# Patient Record
Sex: Female | Born: 1945 | Race: Black or African American | Hispanic: No | Marital: Married | Smoking: Never smoker
Health system: Southern US, Community
[De-identification: ages and names within clinical notes are randomized; demographics above are authoritative.]

## PROBLEM LIST (undated history)

## (undated) DIAGNOSIS — E7800 Pure hypercholesterolemia, unspecified: Secondary | ICD-10-CM

## (undated) DIAGNOSIS — I10 Essential (primary) hypertension: Secondary | ICD-10-CM

---

## 2004-05-30 ENCOUNTER — Emergency Department (HOSPITAL_COMMUNITY): Admission: EM | Admit: 2004-05-30 | Discharge: 2004-05-30 | Payer: Self-pay | Admitting: Emergency Medicine

## 2014-01-14 DIAGNOSIS — M1990 Unspecified osteoarthritis, unspecified site: Secondary | ICD-10-CM | POA: Diagnosis not present

## 2014-01-14 DIAGNOSIS — I10 Essential (primary) hypertension: Secondary | ICD-10-CM | POA: Diagnosis not present

## 2014-01-14 DIAGNOSIS — E669 Obesity, unspecified: Secondary | ICD-10-CM | POA: Diagnosis not present

## 2014-02-14 DIAGNOSIS — E119 Type 2 diabetes mellitus without complications: Secondary | ICD-10-CM | POA: Diagnosis not present

## 2014-07-25 DIAGNOSIS — I10 Essential (primary) hypertension: Secondary | ICD-10-CM | POA: Diagnosis not present

## 2014-07-25 DIAGNOSIS — E041 Nontoxic single thyroid nodule: Secondary | ICD-10-CM | POA: Diagnosis not present

## 2014-07-25 DIAGNOSIS — E669 Obesity, unspecified: Secondary | ICD-10-CM | POA: Diagnosis not present

## 2014-08-02 DIAGNOSIS — I10 Essential (primary) hypertension: Secondary | ICD-10-CM | POA: Diagnosis not present

## 2014-08-02 DIAGNOSIS — E669 Obesity, unspecified: Secondary | ICD-10-CM | POA: Diagnosis not present

## 2014-08-23 DIAGNOSIS — E041 Nontoxic single thyroid nodule: Secondary | ICD-10-CM | POA: Diagnosis not present

## 2014-08-23 DIAGNOSIS — E049 Nontoxic goiter, unspecified: Secondary | ICD-10-CM | POA: Diagnosis not present

## 2014-11-24 DIAGNOSIS — E041 Nontoxic single thyroid nodule: Secondary | ICD-10-CM | POA: Diagnosis not present

## 2014-11-24 DIAGNOSIS — E669 Obesity, unspecified: Secondary | ICD-10-CM | POA: Diagnosis not present

## 2014-11-24 DIAGNOSIS — I10 Essential (primary) hypertension: Secondary | ICD-10-CM | POA: Diagnosis not present

## 2015-09-25 DIAGNOSIS — I10 Essential (primary) hypertension: Secondary | ICD-10-CM | POA: Diagnosis not present

## 2015-09-25 DIAGNOSIS — R5383 Other fatigue: Secondary | ICD-10-CM | POA: Diagnosis not present

## 2015-09-25 DIAGNOSIS — Z131 Encounter for screening for diabetes mellitus: Secondary | ICD-10-CM | POA: Diagnosis not present

## 2015-09-25 DIAGNOSIS — Z13220 Encounter for screening for lipoid disorders: Secondary | ICD-10-CM | POA: Diagnosis not present

## 2015-11-22 DIAGNOSIS — Z7982 Long term (current) use of aspirin: Secondary | ICD-10-CM | POA: Diagnosis not present

## 2015-11-22 DIAGNOSIS — Z79899 Other long term (current) drug therapy: Secondary | ICD-10-CM | POA: Diagnosis not present

## 2015-11-22 DIAGNOSIS — M25511 Pain in right shoulder: Secondary | ICD-10-CM | POA: Diagnosis not present

## 2015-11-22 DIAGNOSIS — M79601 Pain in right arm: Secondary | ICD-10-CM | POA: Diagnosis not present

## 2015-12-08 DIAGNOSIS — I10 Essential (primary) hypertension: Secondary | ICD-10-CM | POA: Diagnosis not present

## 2015-12-08 DIAGNOSIS — Z1211 Encounter for screening for malignant neoplasm of colon: Secondary | ICD-10-CM | POA: Diagnosis not present

## 2015-12-08 DIAGNOSIS — Z880 Allergy status to penicillin: Secondary | ICD-10-CM | POA: Diagnosis not present

## 2015-12-08 DIAGNOSIS — Z79899 Other long term (current) drug therapy: Secondary | ICD-10-CM | POA: Diagnosis not present

## 2015-12-08 DIAGNOSIS — Z833 Family history of diabetes mellitus: Secondary | ICD-10-CM | POA: Diagnosis not present

## 2015-12-08 DIAGNOSIS — Z7982 Long term (current) use of aspirin: Secondary | ICD-10-CM | POA: Diagnosis not present

## 2015-12-08 DIAGNOSIS — M1990 Unspecified osteoarthritis, unspecified site: Secondary | ICD-10-CM | POA: Diagnosis not present

## 2015-12-18 DIAGNOSIS — J40 Bronchitis, not specified as acute or chronic: Secondary | ICD-10-CM | POA: Diagnosis not present

## 2015-12-26 DIAGNOSIS — Z1389 Encounter for screening for other disorder: Secondary | ICD-10-CM | POA: Diagnosis not present

## 2015-12-26 DIAGNOSIS — Z0000 Encounter for general adult medical examination without abnormal findings: Secondary | ICD-10-CM | POA: Diagnosis not present

## 2015-12-26 DIAGNOSIS — I10 Essential (primary) hypertension: Secondary | ICD-10-CM | POA: Diagnosis not present

## 2016-02-01 DIAGNOSIS — H2513 Age-related nuclear cataract, bilateral: Secondary | ICD-10-CM | POA: Diagnosis not present

## 2016-03-15 DIAGNOSIS — J40 Bronchitis, not specified as acute or chronic: Secondary | ICD-10-CM | POA: Diagnosis not present

## 2016-03-15 DIAGNOSIS — J111 Influenza due to unidentified influenza virus with other respiratory manifestations: Secondary | ICD-10-CM | POA: Diagnosis not present

## 2016-03-17 ENCOUNTER — Emergency Department (HOSPITAL_COMMUNITY): Payer: Medicare Other

## 2016-03-17 ENCOUNTER — Encounter (HOSPITAL_COMMUNITY): Payer: Self-pay | Admitting: Physical Medicine and Rehabilitation

## 2016-03-17 ENCOUNTER — Emergency Department (HOSPITAL_COMMUNITY)
Admission: EM | Admit: 2016-03-17 | Discharge: 2016-03-17 | Disposition: A | Discharge status: Home / Self Care | Payer: Medicare Other | Attending: Emergency Medicine | Admitting: Emergency Medicine

## 2016-03-17 DIAGNOSIS — J40 Bronchitis, not specified as acute or chronic: Secondary | ICD-10-CM | POA: Diagnosis not present

## 2016-03-17 DIAGNOSIS — E860 Dehydration: Secondary | ICD-10-CM | POA: Insufficient documentation

## 2016-03-17 DIAGNOSIS — I10 Essential (primary) hypertension: Secondary | ICD-10-CM | POA: Diagnosis not present

## 2016-03-17 DIAGNOSIS — Z880 Allergy status to penicillin: Secondary | ICD-10-CM | POA: Insufficient documentation

## 2016-03-17 DIAGNOSIS — R509 Fever, unspecified: Secondary | ICD-10-CM | POA: Diagnosis not present

## 2016-03-17 DIAGNOSIS — R0602 Shortness of breath: Secondary | ICD-10-CM | POA: Diagnosis present

## 2016-03-17 DIAGNOSIS — R05 Cough: Secondary | ICD-10-CM | POA: Diagnosis not present

## 2016-03-17 HISTORY — DX: Essential (primary) hypertension: I10

## 2016-03-17 LAB — COMPREHENSIVE METABOLIC PANEL
ALBUMIN: 4 g/dL (ref 3.5–5.0)
ALK PHOS: 62 U/L (ref 38–126)
ALT: 32 U/L (ref 14–54)
AST: 40 U/L (ref 15–41)
Anion gap: 12 (ref 5–15)
BILIRUBIN TOTAL: 0.9 mg/dL (ref 0.3–1.2)
BUN: 8 mg/dL (ref 6–20)
CALCIUM: 9.3 mg/dL (ref 8.9–10.3)
CO2: 24 mmol/L (ref 22–32)
Chloride: 100 mmol/L — ABNORMAL LOW (ref 101–111)
Creatinine, Ser: 1.08 mg/dL — ABNORMAL HIGH (ref 0.44–1.00)
GFR calc Af Amer: 59 mL/min — ABNORMAL LOW (ref >60)
GFR calc non Af Amer: 51 mL/min — ABNORMAL LOW (ref >60)
GLUCOSE: 165 mg/dL — AB (ref 65–99)
POTASSIUM: 3.8 mmol/L (ref 3.5–5.1)
Sodium: 136 mmol/L (ref 135–145)
TOTAL PROTEIN: 7.7 g/dL (ref 6.5–8.1)

## 2016-03-17 LAB — CBC WITH DIFFERENTIAL/PLATELET
BASOS ABS: 0 10*3/uL (ref 0.0–0.1)
BASOS PCT: 1 %
Eosinophils Absolute: 0.1 10*3/uL (ref 0.0–0.7)
Eosinophils Relative: 1 %
HEMATOCRIT: 39.8 % (ref 36.0–46.0)
HEMOGLOBIN: 13.5 g/dL (ref 12.0–15.0)
Lymphocytes Relative: 54 %
Lymphs Abs: 2.3 10*3/uL (ref 0.7–4.0)
MCH: 33.8 pg (ref 26.0–34.0)
MCHC: 33.9 g/dL (ref 30.0–36.0)
MCV: 99.5 fL (ref 78.0–100.0)
Monocytes Absolute: 0.5 10*3/uL (ref 0.1–1.0)
Monocytes Relative: 11 %
NEUTROS ABS: 1.4 10*3/uL — AB (ref 1.7–7.7)
NEUTROS PCT: 33 %
Platelets: 230 10*3/uL (ref 150–400)
RBC: 4 MIL/uL (ref 3.87–5.11)
RDW: 12.6 % (ref 11.5–15.5)
WBC: 4.2 10*3/uL (ref 4.0–10.5)

## 2016-03-17 LAB — I-STAT TROPONIN, ED: Troponin i, poc: 0 ng/mL (ref 0.00–0.08)

## 2016-03-17 LAB — I-STAT CG4 LACTIC ACID, ED: Lactic Acid, Venous: 2.16 mmol/L (ref 0.5–2.0)

## 2016-03-17 MED ORDER — ACETAMINOPHEN 325 MG PO TABS
650.0000 mg | ORAL_TABLET | Freq: Once | ORAL | Status: AC
  Administered 2016-03-17: 650 mg via ORAL
  Filled 2016-03-17: qty 2

## 2016-03-17 MED ORDER — SODIUM CHLORIDE 0.9 % IV BOLUS (SEPSIS)
1000.0000 mL | Freq: Once | INTRAVENOUS | Status: AC
  Administered 2016-03-17: 1000 mL via INTRAVENOUS

## 2016-03-17 MED ORDER — ALBUTEROL SULFATE HFA 108 (90 BASE) MCG/ACT IN AERS: 1.0000 | INHALATION_SPRAY | Freq: Four times a day (QID) | RESPIRATORY_TRACT | Status: AC | PRN

## 2016-03-17 MED ORDER — IPRATROPIUM-ALBUTEROL 0.5-2.5 (3) MG/3ML IN SOLN
3.0000 mL | Freq: Once | RESPIRATORY_TRACT | Status: AC
  Administered 2016-03-17: 3 mL via RESPIRATORY_TRACT
  Filled 2016-03-17: qty 3

## 2016-03-17 NOTE — ED Provider Notes (Signed)
CSN: [id number]     Arrival date & time 03/17/16  1040 History   First MD Initiated Contact with Patient 03/17/16 1043     Chief Complaint  Patient presents with  . Shortness of Breath     (Consider location/radiation/quality/duration/timing/severity/associated sxs/prior Treatment) The history is provided by the patient.  Reginia FortsRachel A Headrick is a 70 y.o. female here with cough, fever. Coughing for several months, subjective fever yesterday. Also has generalized weakness yesterday. Went to urgent care yesterday and had a positive flu swab as well as a chest x-ray that shows possible pneumonia. She was started on Levaquin and Tamiflu. She states that these medication was too strong for her and she had some headaches yesterday. She also vomited once. Had some subjective chills still but denies any further fevers. Denies abdominal pain.     Past Medical History  Diagnosis Date  . Hypertension    History reviewed. No pertinent past surgical history. No family history on file. Social History  Substance Use Topics  . Smoking status: Never Smoker   . Smokeless tobacco: None  . Alcohol Use: No   OB History    No data available     Review of Systems  Constitutional: Positive for chills.  Respiratory: Positive for cough.   All other systems reviewed and are negative.     Allergies  Penicillins  Home Medications   Prior to Admission medications   Not on File   BP 111/80 mmHg  Pulse 74  Temp(Src) 99.3 F (37.4 C) (Oral)  Resp 20  Ht 5\' 5"  (1.651 m)  Wt 260 lb (117.935 kg)  BMI 43.27 kg/m2  SpO2 97% Physical Exam  Constitutional: She is oriented to person, place, and time.  Slightly dehydrated, uncomfortable   HENT:  Head: Normocephalic.  OP slightly dry   Eyes: Conjunctivae are normal. Pupils are equal, round, and reactive to light.  Neck: Normal range of motion. Neck supple.  Cardiovascular: Normal rate, regular rhythm and normal heart sounds.   Pulmonary/Chest: Effort  normal.  Diminished Right base   Abdominal: Soft. Bowel sounds are normal. She exhibits no distension. There is no tenderness. There is no rebound.  Musculoskeletal: Normal range of motion. She exhibits no edema or tenderness.  Neurological: She is alert and oriented to person, place, and time. No cranial nerve deficit. Coordination normal.  Skin: Skin is warm and dry.  Psychiatric: She has a normal mood and affect. Her behavior is normal. Judgment and thought content normal.  Nursing note and vitals reviewed.   ED Course  Procedures (including critical care time) Labs Review Labs Reviewed  CBC WITH DIFFERENTIAL/PLATELET - Abnormal; Notable for the following:    Neutro Abs 1.4 (*)    All other components within normal limits  COMPREHENSIVE METABOLIC PANEL - Abnormal; Notable for the following:    Chloride 100 (*)    Glucose, Bld 165 (*)    Creatinine, Ser 1.08 (*)    GFR calc non Af Amer 51 (*)    GFR calc Af Amer 59 (*)    All other components within normal limits  I-STAT CG4 LACTIC ACID, ED - Abnormal; Notable for the following:    Lactic Acid, Venous 2.16 (*)    All other components within normal limits  I-STAT TROPOININ, ED    Imaging Review Dg Chest 2 View  03/17/2016  CLINICAL DATA:  Cough.  Slight fever. EXAM: CHEST  2 VIEW COMPARISON:  None. FINDINGS: The heart size and mediastinal contours are  within normal limits. Both lungs are clear. The visualized skeletal structures are unremarkable. IMPRESSION: No active cardiopulmonary disease. Electronically Signed   By: Gerome Sam III M.D   On: 03/17/2016 11:53   I have personally reviewed and evaluated these images and lab results as part of my medical decision-making.   EKG Interpretation   Date/Time:  Sunday March 17 2016 10:58:12 EDT Ventricular Rate:  75 PR Interval:  166 QRS Duration: 97 QT Interval:  410 QTC Calculation: 458 R Axis:   24 Text Interpretation:  Sinus rhythm Ventricular premature complex Low   voltage, precordial leads Borderline T abnormalities, anterior leads  Baseline wander in lead(s) I II aVR aVF No previous ECGs available  Confirmed by Kamyia Thomason  MD, Viney Acocella ([id number]) on 03/17/2016 11:07:01 AM      MDM   Final diagnoses:  None   TYSON MASIN is a 70 y.o. female here with cough, chills. Diagnosed with flu and pneumonia in urgent care yesterday. Appears slightly dehydrated. Will get labs, lactate, CXR. Will hydrate and reassess.   1:03 PM Patient afebrile. CXR clear. WBC nl. Lactate 2.1 but given 1 L NS. Doesn't appear septic. Given that there is no pneumonia on CXR and she didn't tolerate the levaquin, will dc levaquin. Discussed risks and benefits of tamiflu and patient doesn't want to take it. Recommend tylenol, motrin for fever. Can give albuterol as needed for cough/bronchitis.     Richardean Canal, MD 03/17/16 [phone]

## 2016-03-17 NOTE — ED Notes (Signed)
Pt reports SOB, fatigue, fever, and cough. Was diagnosed with influenza and pneumonia at Urgent Care on Saturday. Pt reports no relief of symptoms.

## 2016-03-17 NOTE — Discharge Instructions (Signed)
Stop taking levaquin.   Stay hydrated.   Take tylenol, motrin for fever.   Use albuterol every 6 hrs as needed for cough.   You can also try robitussin or delsym or mucinex for cough   See your doctor   Return to ER if you have worse cough, shortness of breath, fevers, worse trouble breathing.

## 2016-03-28 DIAGNOSIS — Z0000 Encounter for general adult medical examination without abnormal findings: Secondary | ICD-10-CM | POA: Diagnosis not present

## 2016-03-28 DIAGNOSIS — I10 Essential (primary) hypertension: Secondary | ICD-10-CM | POA: Diagnosis not present

## 2016-03-28 DIAGNOSIS — J4520 Mild intermittent asthma, uncomplicated: Secondary | ICD-10-CM | POA: Diagnosis not present

## 2016-03-28 DIAGNOSIS — Z131 Encounter for screening for diabetes mellitus: Secondary | ICD-10-CM | POA: Diagnosis not present

## 2016-07-08 DIAGNOSIS — I10 Essential (primary) hypertension: Secondary | ICD-10-CM | POA: Diagnosis not present

## 2016-10-08 DIAGNOSIS — J4520 Mild intermittent asthma, uncomplicated: Secondary | ICD-10-CM | POA: Diagnosis not present

## 2016-10-08 DIAGNOSIS — I10 Essential (primary) hypertension: Secondary | ICD-10-CM | POA: Diagnosis not present

## 2016-10-08 DIAGNOSIS — Z131 Encounter for screening for diabetes mellitus: Secondary | ICD-10-CM | POA: Diagnosis not present

## 2017-02-04 DIAGNOSIS — J4520 Mild intermittent asthma, uncomplicated: Secondary | ICD-10-CM | POA: Diagnosis not present

## 2017-02-04 DIAGNOSIS — I10 Essential (primary) hypertension: Secondary | ICD-10-CM | POA: Diagnosis not present

## 2017-02-04 DIAGNOSIS — Z0000 Encounter for general adult medical examination without abnormal findings: Secondary | ICD-10-CM | POA: Diagnosis not present

## 2017-02-04 DIAGNOSIS — N182 Chronic kidney disease, stage 2 (mild): Secondary | ICD-10-CM | POA: Diagnosis not present

## 2017-02-04 DIAGNOSIS — Z1389 Encounter for screening for other disorder: Secondary | ICD-10-CM | POA: Diagnosis not present

## 2017-02-07 DIAGNOSIS — Z23 Encounter for immunization: Secondary | ICD-10-CM | POA: Diagnosis not present

## 2017-05-06 DIAGNOSIS — I10 Essential (primary) hypertension: Secondary | ICD-10-CM | POA: Diagnosis not present

## 2017-05-06 DIAGNOSIS — N182 Chronic kidney disease, stage 2 (mild): Secondary | ICD-10-CM | POA: Diagnosis not present

## 2017-05-06 DIAGNOSIS — J4520 Mild intermittent asthma, uncomplicated: Secondary | ICD-10-CM | POA: Diagnosis not present

## 2017-08-15 DIAGNOSIS — J4520 Mild intermittent asthma, uncomplicated: Secondary | ICD-10-CM | POA: Diagnosis not present

## 2017-08-15 DIAGNOSIS — M545 Low back pain: Secondary | ICD-10-CM | POA: Diagnosis not present

## 2017-08-15 DIAGNOSIS — N182 Chronic kidney disease, stage 2 (mild): Secondary | ICD-10-CM | POA: Diagnosis not present

## 2017-08-15 DIAGNOSIS — I10 Essential (primary) hypertension: Secondary | ICD-10-CM | POA: Diagnosis not present

## 2017-12-31 DIAGNOSIS — I10 Essential (primary) hypertension: Secondary | ICD-10-CM | POA: Diagnosis not present

## 2017-12-31 DIAGNOSIS — N182 Chronic kidney disease, stage 2 (mild): Secondary | ICD-10-CM | POA: Diagnosis not present

## 2017-12-31 DIAGNOSIS — M545 Low back pain: Secondary | ICD-10-CM | POA: Diagnosis not present

## 2017-12-31 DIAGNOSIS — J4520 Mild intermittent asthma, uncomplicated: Secondary | ICD-10-CM | POA: Diagnosis not present

## 2018-07-01 DIAGNOSIS — I10 Essential (primary) hypertension: Secondary | ICD-10-CM | POA: Diagnosis not present

## 2018-07-01 DIAGNOSIS — Z0000 Encounter for general adult medical examination without abnormal findings: Secondary | ICD-10-CM | POA: Diagnosis not present

## 2018-07-01 DIAGNOSIS — M545 Low back pain: Secondary | ICD-10-CM | POA: Diagnosis not present

## 2018-07-01 DIAGNOSIS — Z1389 Encounter for screening for other disorder: Secondary | ICD-10-CM | POA: Diagnosis not present

## 2018-07-01 DIAGNOSIS — J4520 Mild intermittent asthma, uncomplicated: Secondary | ICD-10-CM | POA: Diagnosis not present

## 2018-07-01 DIAGNOSIS — N182 Chronic kidney disease, stage 2 (mild): Secondary | ICD-10-CM | POA: Diagnosis not present

## 2018-08-17 DIAGNOSIS — E2839 Other primary ovarian failure: Secondary | ICD-10-CM | POA: Diagnosis not present

## 2018-08-17 DIAGNOSIS — M810 Age-related osteoporosis without current pathological fracture: Secondary | ICD-10-CM | POA: Diagnosis not present

## 2018-10-08 DIAGNOSIS — N182 Chronic kidney disease, stage 2 (mild): Secondary | ICD-10-CM | POA: Diagnosis not present

## 2018-10-08 DIAGNOSIS — M545 Low back pain: Secondary | ICD-10-CM | POA: Diagnosis not present

## 2018-10-08 DIAGNOSIS — I10 Essential (primary) hypertension: Secondary | ICD-10-CM | POA: Diagnosis not present

## 2018-10-08 DIAGNOSIS — J4520 Mild intermittent asthma, uncomplicated: Secondary | ICD-10-CM | POA: Diagnosis not present

## 2019-01-13 DIAGNOSIS — Z6841 Body Mass Index (BMI) 40.0 and over, adult: Secondary | ICD-10-CM | POA: Diagnosis not present

## 2019-01-13 DIAGNOSIS — M545 Low back pain: Secondary | ICD-10-CM | POA: Diagnosis not present

## 2019-01-13 DIAGNOSIS — J4520 Mild intermittent asthma, uncomplicated: Secondary | ICD-10-CM | POA: Diagnosis not present

## 2019-01-13 DIAGNOSIS — N182 Chronic kidney disease, stage 2 (mild): Secondary | ICD-10-CM | POA: Diagnosis not present

## 2019-01-13 DIAGNOSIS — I10 Essential (primary) hypertension: Secondary | ICD-10-CM | POA: Diagnosis not present

## 2019-04-12 DIAGNOSIS — M170 Bilateral primary osteoarthritis of knee: Secondary | ICD-10-CM | POA: Diagnosis not present

## 2019-04-12 DIAGNOSIS — Z7982 Long term (current) use of aspirin: Secondary | ICD-10-CM | POA: Diagnosis not present

## 2019-04-12 DIAGNOSIS — M5137 Other intervertebral disc degeneration, lumbosacral region: Secondary | ICD-10-CM | POA: Diagnosis not present

## 2019-04-12 DIAGNOSIS — I10 Essential (primary) hypertension: Secondary | ICD-10-CM | POA: Diagnosis not present

## 2019-04-12 DIAGNOSIS — R262 Difficulty in walking, not elsewhere classified: Secondary | ICD-10-CM | POA: Diagnosis not present

## 2019-04-20 DIAGNOSIS — M545 Low back pain: Secondary | ICD-10-CM | POA: Diagnosis not present

## 2019-04-20 DIAGNOSIS — J4520 Mild intermittent asthma, uncomplicated: Secondary | ICD-10-CM | POA: Diagnosis not present

## 2019-04-20 DIAGNOSIS — I10 Essential (primary) hypertension: Secondary | ICD-10-CM | POA: Diagnosis not present

## 2019-04-20 DIAGNOSIS — Z6841 Body Mass Index (BMI) 40.0 and over, adult: Secondary | ICD-10-CM | POA: Diagnosis not present

## 2019-04-20 DIAGNOSIS — N182 Chronic kidney disease, stage 2 (mild): Secondary | ICD-10-CM | POA: Diagnosis not present

## 2019-07-26 DIAGNOSIS — J4520 Mild intermittent asthma, uncomplicated: Secondary | ICD-10-CM | POA: Diagnosis not present

## 2019-07-26 DIAGNOSIS — Z6841 Body Mass Index (BMI) 40.0 and over, adult: Secondary | ICD-10-CM | POA: Diagnosis not present

## 2019-07-26 DIAGNOSIS — M545 Low back pain: Secondary | ICD-10-CM | POA: Diagnosis not present

## 2019-07-26 DIAGNOSIS — I10 Essential (primary) hypertension: Secondary | ICD-10-CM | POA: Diagnosis not present

## 2019-07-26 DIAGNOSIS — N182 Chronic kidney disease, stage 2 (mild): Secondary | ICD-10-CM | POA: Diagnosis not present

## 2019-08-30 DIAGNOSIS — Z880 Allergy status to penicillin: Secondary | ICD-10-CM | POA: Diagnosis not present

## 2019-08-30 DIAGNOSIS — S299XXA Unspecified injury of thorax, initial encounter: Secondary | ICD-10-CM | POA: Diagnosis not present

## 2019-08-30 DIAGNOSIS — M546 Pain in thoracic spine: Secondary | ICD-10-CM | POA: Diagnosis not present

## 2019-08-30 DIAGNOSIS — M542 Cervicalgia: Secondary | ICD-10-CM | POA: Diagnosis not present

## 2019-08-30 DIAGNOSIS — W19XXXA Unspecified fall, initial encounter: Secondary | ICD-10-CM | POA: Diagnosis not present

## 2019-08-30 DIAGNOSIS — I10 Essential (primary) hypertension: Secondary | ICD-10-CM | POA: Diagnosis not present

## 2019-08-30 DIAGNOSIS — Z79899 Other long term (current) drug therapy: Secondary | ICD-10-CM | POA: Diagnosis not present

## 2019-08-31 DIAGNOSIS — Z6841 Body Mass Index (BMI) 40.0 and over, adult: Secondary | ICD-10-CM | POA: Diagnosis not present

## 2019-08-31 DIAGNOSIS — M545 Low back pain: Secondary | ICD-10-CM | POA: Diagnosis not present

## 2019-08-31 DIAGNOSIS — M5489 Other dorsalgia: Secondary | ICD-10-CM | POA: Diagnosis not present

## 2019-09-14 DIAGNOSIS — M5489 Other dorsalgia: Secondary | ICD-10-CM | POA: Diagnosis not present

## 2019-09-14 DIAGNOSIS — M25511 Pain in right shoulder: Secondary | ICD-10-CM | POA: Diagnosis not present

## 2019-09-14 DIAGNOSIS — Z6841 Body Mass Index (BMI) 40.0 and over, adult: Secondary | ICD-10-CM | POA: Diagnosis not present

## 2019-09-23 DIAGNOSIS — M799 Soft tissue disorder, unspecified: Secondary | ICD-10-CM | POA: Diagnosis not present

## 2019-09-23 DIAGNOSIS — M25611 Stiffness of right shoulder, not elsewhere classified: Secondary | ICD-10-CM | POA: Diagnosis not present

## 2019-09-23 DIAGNOSIS — M62511 Muscle wasting and atrophy, not elsewhere classified, right shoulder: Secondary | ICD-10-CM | POA: Diagnosis not present

## 2019-09-23 DIAGNOSIS — M25511 Pain in right shoulder: Secondary | ICD-10-CM | POA: Diagnosis not present

## 2019-09-23 DIAGNOSIS — R293 Abnormal posture: Secondary | ICD-10-CM | POA: Diagnosis not present

## 2019-09-27 DIAGNOSIS — M799 Soft tissue disorder, unspecified: Secondary | ICD-10-CM | POA: Diagnosis not present

## 2019-09-27 DIAGNOSIS — R293 Abnormal posture: Secondary | ICD-10-CM | POA: Diagnosis not present

## 2019-09-27 DIAGNOSIS — M25511 Pain in right shoulder: Secondary | ICD-10-CM | POA: Diagnosis not present

## 2019-09-27 DIAGNOSIS — M62511 Muscle wasting and atrophy, not elsewhere classified, right shoulder: Secondary | ICD-10-CM | POA: Diagnosis not present

## 2019-09-27 DIAGNOSIS — M25611 Stiffness of right shoulder, not elsewhere classified: Secondary | ICD-10-CM | POA: Diagnosis not present

## 2019-09-29 DIAGNOSIS — M62511 Muscle wasting and atrophy, not elsewhere classified, right shoulder: Secondary | ICD-10-CM | POA: Diagnosis not present

## 2019-09-29 DIAGNOSIS — R293 Abnormal posture: Secondary | ICD-10-CM | POA: Diagnosis not present

## 2019-09-29 DIAGNOSIS — M25511 Pain in right shoulder: Secondary | ICD-10-CM | POA: Diagnosis not present

## 2019-09-29 DIAGNOSIS — M25611 Stiffness of right shoulder, not elsewhere classified: Secondary | ICD-10-CM | POA: Diagnosis not present

## 2019-09-29 DIAGNOSIS — M799 Soft tissue disorder, unspecified: Secondary | ICD-10-CM | POA: Diagnosis not present

## 2019-10-11 DIAGNOSIS — M62511 Muscle wasting and atrophy, not elsewhere classified, right shoulder: Secondary | ICD-10-CM | POA: Diagnosis not present

## 2019-10-11 DIAGNOSIS — M25611 Stiffness of right shoulder, not elsewhere classified: Secondary | ICD-10-CM | POA: Diagnosis not present

## 2019-10-11 DIAGNOSIS — M25511 Pain in right shoulder: Secondary | ICD-10-CM | POA: Diagnosis not present

## 2019-10-11 DIAGNOSIS — R293 Abnormal posture: Secondary | ICD-10-CM | POA: Diagnosis not present

## 2019-10-11 DIAGNOSIS — M799 Soft tissue disorder, unspecified: Secondary | ICD-10-CM | POA: Diagnosis not present

## 2019-10-12 DIAGNOSIS — M25511 Pain in right shoulder: Secondary | ICD-10-CM | POA: Diagnosis not present

## 2019-10-12 DIAGNOSIS — Z6841 Body Mass Index (BMI) 40.0 and over, adult: Secondary | ICD-10-CM | POA: Diagnosis not present

## 2019-10-12 DIAGNOSIS — Z1389 Encounter for screening for other disorder: Secondary | ICD-10-CM | POA: Diagnosis not present

## 2019-10-12 DIAGNOSIS — Z0000 Encounter for general adult medical examination without abnormal findings: Secondary | ICD-10-CM | POA: Diagnosis not present

## 2019-10-12 DIAGNOSIS — M5489 Other dorsalgia: Secondary | ICD-10-CM | POA: Diagnosis not present

## 2019-10-13 DIAGNOSIS — M62511 Muscle wasting and atrophy, not elsewhere classified, right shoulder: Secondary | ICD-10-CM | POA: Diagnosis not present

## 2019-10-13 DIAGNOSIS — M25611 Stiffness of right shoulder, not elsewhere classified: Secondary | ICD-10-CM | POA: Diagnosis not present

## 2019-10-13 DIAGNOSIS — R293 Abnormal posture: Secondary | ICD-10-CM | POA: Diagnosis not present

## 2019-10-13 DIAGNOSIS — M799 Soft tissue disorder, unspecified: Secondary | ICD-10-CM | POA: Diagnosis not present

## 2019-10-13 DIAGNOSIS — M25511 Pain in right shoulder: Secondary | ICD-10-CM | POA: Diagnosis not present

## 2019-10-18 DIAGNOSIS — R293 Abnormal posture: Secondary | ICD-10-CM | POA: Diagnosis not present

## 2019-10-18 DIAGNOSIS — M799 Soft tissue disorder, unspecified: Secondary | ICD-10-CM | POA: Diagnosis not present

## 2019-10-18 DIAGNOSIS — M25511 Pain in right shoulder: Secondary | ICD-10-CM | POA: Diagnosis not present

## 2019-10-18 DIAGNOSIS — M25611 Stiffness of right shoulder, not elsewhere classified: Secondary | ICD-10-CM | POA: Diagnosis not present

## 2019-10-18 DIAGNOSIS — M62511 Muscle wasting and atrophy, not elsewhere classified, right shoulder: Secondary | ICD-10-CM | POA: Diagnosis not present

## 2019-10-20 DIAGNOSIS — M62511 Muscle wasting and atrophy, not elsewhere classified, right shoulder: Secondary | ICD-10-CM | POA: Diagnosis not present

## 2019-10-20 DIAGNOSIS — M25611 Stiffness of right shoulder, not elsewhere classified: Secondary | ICD-10-CM | POA: Diagnosis not present

## 2019-10-20 DIAGNOSIS — R293 Abnormal posture: Secondary | ICD-10-CM | POA: Diagnosis not present

## 2019-10-20 DIAGNOSIS — M799 Soft tissue disorder, unspecified: Secondary | ICD-10-CM | POA: Diagnosis not present

## 2019-10-20 DIAGNOSIS — M25511 Pain in right shoulder: Secondary | ICD-10-CM | POA: Diagnosis not present

## 2019-10-25 DIAGNOSIS — M25611 Stiffness of right shoulder, not elsewhere classified: Secondary | ICD-10-CM | POA: Diagnosis not present

## 2019-10-25 DIAGNOSIS — M799 Soft tissue disorder, unspecified: Secondary | ICD-10-CM | POA: Diagnosis not present

## 2019-10-25 DIAGNOSIS — M62511 Muscle wasting and atrophy, not elsewhere classified, right shoulder: Secondary | ICD-10-CM | POA: Diagnosis not present

## 2019-10-25 DIAGNOSIS — M25511 Pain in right shoulder: Secondary | ICD-10-CM | POA: Diagnosis not present

## 2019-10-25 DIAGNOSIS — R293 Abnormal posture: Secondary | ICD-10-CM | POA: Diagnosis not present

## 2019-10-27 ENCOUNTER — Encounter (HOSPITAL_COMMUNITY): Payer: Self-pay | Admitting: Emergency Medicine

## 2019-10-27 ENCOUNTER — Emergency Department (HOSPITAL_COMMUNITY)
Admission: EM | Admit: 2019-10-27 | Discharge: 2019-10-27 | Disposition: A | Discharge status: Home / Self Care | Payer: Medicare HMO | Attending: Emergency Medicine | Admitting: Emergency Medicine

## 2019-10-27 ENCOUNTER — Other Ambulatory Visit: Payer: Self-pay

## 2019-10-27 DIAGNOSIS — Y929 Unspecified place or not applicable: Secondary | ICD-10-CM | POA: Diagnosis not present

## 2019-10-27 DIAGNOSIS — Y9389 Activity, other specified: Secondary | ICD-10-CM | POA: Insufficient documentation

## 2019-10-27 DIAGNOSIS — I10 Essential (primary) hypertension: Secondary | ICD-10-CM | POA: Diagnosis not present

## 2019-10-27 DIAGNOSIS — Y999 Unspecified external cause status: Secondary | ICD-10-CM | POA: Diagnosis not present

## 2019-10-27 DIAGNOSIS — X58XXXA Exposure to other specified factors, initial encounter: Secondary | ICD-10-CM | POA: Diagnosis not present

## 2019-10-27 DIAGNOSIS — Z23 Encounter for immunization: Secondary | ICD-10-CM | POA: Insufficient documentation

## 2019-10-27 DIAGNOSIS — S01512A Laceration without foreign body of oral cavity, initial encounter: Secondary | ICD-10-CM | POA: Insufficient documentation

## 2019-10-27 HISTORY — DX: Pure hypercholesterolemia, unspecified: E78.00

## 2019-10-27 MED ORDER — LIDOCAINE VISCOUS HCL 2 % MT SOLN
15.0000 mL | Freq: Once | OROMUCOSAL | Status: AC
  Administered 2019-10-27: 15 mL via ORAL
  Filled 2019-10-27: qty 15

## 2019-10-27 MED ORDER — ALUM & MAG HYDROXIDE-SIMETH 200-200-20 MG/5ML PO SUSP
30.0000 mL | Freq: Once | ORAL | Status: AC
  Administered 2019-10-27: 20:00:00 30 mL via ORAL
  Filled 2019-10-27: qty 30

## 2019-10-27 MED ORDER — TETANUS-DIPHTH-ACELL PERTUSSIS 5-2.5-18.5 LF-MCG/0.5 IM SUSP
0.5000 mL | Freq: Once | INTRAMUSCULAR | Status: AC
  Administered 2019-10-27: 20:00:00 0.5 mL via INTRAMUSCULAR
  Filled 2019-10-27: qty 0.5

## 2019-10-27 NOTE — Discharge Instructions (Signed)
Rinse your mouth with salt water after eating to help keep the wound clean.  Please follow-up with your doctor as needed.  Please return the emergency department if you develop any new or worsening symptoms.

## 2019-10-27 NOTE — ED Notes (Signed)
Pt has had water to drink without difficulty   Awaiting reeval and dispo

## 2019-10-27 NOTE — ED Triage Notes (Signed)
Patient states she was eating a piece of chicken and felt something cut the top of her mouth. Patient states there was a "good amount of bleeding." No bleeding at present.

## 2019-10-27 NOTE — ED Provider Notes (Signed)
[facility] EMERGENCY DEPARTMENT Provider Note   CSN: [id number] Arrival date & time: 10/27/19  1743     History   Chief Complaint Chief Complaint  Patient presents with  . Mouth Injury    HPI Kendra Johnson is a 73 y.o. female with history of hypertension, hypercholesterolemia who presents with laceration to her mouth after eating chicken wings.  She is unsure what exactly cut her as she does not believe she had a bone in her mouth.  She reports she felt like something was stuck in the back of her throat and she got out and was spitting and began spitting up a lot of blood.  She is able to swallow and breathe without difficulty now.  Bleeding has stopped.  Her tetanus is not up-to-date.     HPI  Past Medical History:  Diagnosis Date  . Hypercholesterolemia   . Hypertension     There are no active problems to display for this patient.   History reviewed. No pertinent surgical history.   OB History    Gravida      Para      Term      Preterm      AB      Living  3     SAB      TAB      Ectopic      Multiple      Live Births               Home Medications    Prior to Admission medications   Medication Sig Start Date End Date Taking? Authorizing Provider  albuterol (PROVENTIL HFA;VENTOLIN HFA) 108 (90 Base) MCG/ACT inhaler Inhale 1-2 puffs into the lungs every 6 (six) hours as needed for wheezing or shortness of breath. 03/17/16   Charlynne Pander, MD    Family History Family History  Problem Relation Age of Onset  . Diabetes Other   . Hypertension Other   . Cancer Other     Social History Social History   Tobacco Use  . Smoking status: Never Smoker  . Smokeless tobacco: Never Used  Substance Use Topics  . Alcohol use: No  . Drug use: No     Allergies   Penicillins   Review of Systems Review of Systems  HENT: Negative for sore throat.   Respiratory: Negative for shortness of breath.   Skin: Positive for wound.      Physical Exam Updated Vital Signs BP (!) 158/84 (BP Location: Left Arm)   Pulse 87   Temp 99.3 F (37.4 C) (Oral)   Resp 16   Ht 5\' 4"  (1.626 m)   Wt 122.5 kg   SpO2 97%   BMI 46.35 kg/m   Physical Exam Vitals signs and nursing note reviewed.  Constitutional:      General: She is not in acute distress.    Appearance: She is well-developed. She is not diaphoretic.  HENT:     Head: Normocephalic and atraumatic.     Mouth/Throat:     Pharynx: No oropharyngeal exudate.   Eyes:     General: No scleral icterus.       Right eye: No discharge.        Left eye: No discharge.     Conjunctiva/sclera: Conjunctivae normal.     Pupils: Pupils are equal, round, and reactive to light.  Neck:     Musculoskeletal: Normal range of motion and neck supple.     Thyroid: No  thyromegaly.  Cardiovascular:     Rate and Rhythm: Normal rate and regular rhythm.     Heart sounds: Normal heart sounds. No murmur. No friction rub. No gallop.   Pulmonary:     Effort: Pulmonary effort is normal. No respiratory distress.     Breath sounds: Normal breath sounds. No stridor. No wheezing or rales.  Abdominal:     General: Bowel sounds are normal. There is no distension.     Palpations: Abdomen is soft.     Tenderness: There is no abdominal tenderness. There is no guarding or rebound.  Lymphadenopathy:     Cervical: No cervical adenopathy.  Skin:    General: Skin is warm and dry.     Coloration: Skin is not pale.     Findings: No rash.  Neurological:     Mental Status: She is alert.     Coordination: Coordination normal.      ED Treatments / Results  Labs (all labs ordered are listed, but only abnormal results are displayed) Labs Reviewed - No data to display  EKG None  Radiology No results found.  Procedures Procedures (including critical care time)  Medications Ordered in ED Medications  Tdap (BOOSTRIX) injection 0.5 mL (0.5 mLs Intramuscular Given 10/27/19 1939)  alum & mag  hydroxide-simeth (MAALOX/MYLANTA) 200-200-20 MG/5ML suspension 30 mL (30 mLs Oral Given 10/27/19 1941)    And  lidocaine (XYLOCAINE) 2 % viscous mouth solution 15 mL (15 mLs Oral Given 10/27/19 1941)     Initial Impression / Assessment and Plan / ED Course  I have reviewed the triage vital signs and the nursing notes.  Pertinent labs & imaging results that were available during my care of the patient were reviewed by me and considered in my medical decision making (see chart for details).        Patient presenting with injury to her soft palate after eating chicken wings.  There is a very small superficial laceration with erythema surrounding.  There is no mass or through and through injury.  Patient given Magic mouthwash.  Tetanus updated.  Advised salt water rinses after eating to keep the wound clean.  No intervention for repair indicated today.  Return precautions discussed.  Patient understands and agrees with plan.  Patient vital stable throughout ED course and discharged in satisfactory condition.  Patient also guided by my attending, Dr. Wyvonnia Dusky, who guided the patient's management and agrees with plan.  Final Clinical Impressions(s) / ED Diagnoses   Final diagnoses:  Laceration of palate, initial encounter    ED Discharge Orders    None       Frederica Kuster, Hershal Coria 10/27/19 2017    Ezequiel Essex, MD 10/28/19 [phone]

## 2019-11-01 DIAGNOSIS — Z6841 Body Mass Index (BMI) 40.0 and over, adult: Secondary | ICD-10-CM | POA: Diagnosis not present

## 2019-11-01 DIAGNOSIS — I10 Essential (primary) hypertension: Secondary | ICD-10-CM | POA: Diagnosis not present

## 2020-12-01 ENCOUNTER — Emergency Department (HOSPITAL_COMMUNITY): Payer: Medicare HMO

## 2020-12-01 ENCOUNTER — Other Ambulatory Visit: Payer: Self-pay

## 2020-12-01 ENCOUNTER — Encounter (HOSPITAL_COMMUNITY): Payer: Self-pay | Admitting: *Deleted

## 2020-12-01 ENCOUNTER — Emergency Department (HOSPITAL_COMMUNITY)
Admission: EM | Admit: 2020-12-01 | Discharge: 2020-12-02 | Disposition: A | Discharge status: Home / Self Care | Payer: Medicare HMO | Attending: Emergency Medicine | Admitting: Emergency Medicine

## 2020-12-01 DIAGNOSIS — I10 Essential (primary) hypertension: Secondary | ICD-10-CM | POA: Diagnosis not present

## 2020-12-01 DIAGNOSIS — R0789 Other chest pain: Secondary | ICD-10-CM

## 2020-12-01 LAB — CBC WITH DIFFERENTIAL/PLATELET
Abs Immature Granulocytes: 0.01 10*3/uL (ref 0.00–0.07)
Basophils Absolute: 0 10*3/uL (ref 0.0–0.1)
Basophils Relative: 0 %
Eosinophils Absolute: 0.1 10*3/uL (ref 0.0–0.5)
Eosinophils Relative: 2 %
HCT: 39.8 % (ref 36.0–46.0)
Hemoglobin: 13.5 g/dL (ref 12.0–15.0)
Immature Granulocytes: 0 %
Lymphocytes Relative: 40 %
Lymphs Abs: 3.1 10*3/uL (ref 0.7–4.0)
MCH: 33.8 pg (ref 26.0–34.0)
MCHC: 33.9 g/dL (ref 30.0–36.0)
MCV: 99.5 fL (ref 80.0–100.0)
Monocytes Absolute: 0.5 10*3/uL (ref 0.1–1.0)
Monocytes Relative: 6 %
Neutro Abs: 3.9 10*3/uL (ref 1.7–7.7)
Neutrophils Relative %: 52 %
Platelets: 275 10*3/uL (ref 150–400)
RBC: 4 MIL/uL (ref 3.87–5.11)
RDW: 11.9 % (ref 11.5–15.5)
WBC: 7.6 10*3/uL (ref 4.0–10.5)
nRBC: 0 % (ref 0.0–0.2)

## 2020-12-01 NOTE — ED Provider Notes (Signed)
[facility] EMERGENCY DEPARTMENT Provider Note   CSN: [id number] Arrival date & time: 12/01/20  2240     History Chief Complaint  Patient presents with   Hypertension    Kendra Johnson is a 74 y.o. female.  HPI     This is a 74 year old female with a history of hyperlipidemia and hypertension who presents with concerns for high blood pressure.  Patient reports that she was recently taken off of her fluid pill.  Since that time she has had difficulty regulating her blood pressures.  She states her normal blood pressure 130s over 70s.  However lately she has been 140s to 160s over 90s to 100s.  She takes her breath pressure several times per day.  Earlier this evening she took her blood pressure and it was 140/110.  She states at that time she felt lightheaded.  She denies any room spinning dizziness.  She also had some chest pressure.  No shortness of breath or diaphoresis.  She was not doing anything when this started.  She denies any exertional symptoms.  No known history of coronary artery disease.  She is not currently having any symptoms.  She reports that her chest pressure lasted for about 1 hour.  Past Medical History:  Diagnosis Date   Hypercholesterolemia    Hypertension     There are no problems to display for this patient.   Past Surgical History:  Procedure Laterality Date   ABDOMINAL HYSTERECTOMY       OB History    Gravida      Para      Term      Preterm      AB      Living  3     SAB      IAB      Ectopic      Multiple      Live Births              Family History  Problem Relation Age of Onset   Diabetes Other    Hypertension Other    Cancer Other     Social History   Tobacco Use   Smoking status: Never Smoker   Smokeless tobacco: Never Used  Vaping Use   Vaping Use: Never used  Substance Use Topics   Alcohol use: No   Drug use: No    Home Medications Prior to Admission medications   Medication Sig  Start Date End Date Taking? Authorizing Provider  albuterol (PROVENTIL HFA;VENTOLIN HFA) 108 (90 Base) MCG/ACT inhaler Inhale 1-2 puffs into the lungs every 6 (six) hours as needed for wheezing or shortness of breath. 03/17/16   Charlynne Pander, MD    Allergies    Penicillins  Review of Systems   Review of Systems  Constitutional: Negative for fever.  Respiratory: Positive for chest tightness. Negative for shortness of breath.   Cardiovascular: Negative for chest pain and leg swelling.  Gastrointestinal: Negative for abdominal pain, diarrhea and vomiting.  Genitourinary: Negative for dysuria.  Neurological: Positive for light-headedness. Negative for dizziness, weakness and numbness.  All other systems reviewed and are negative.   Physical Exam Updated Vital Signs BP 129/66    Pulse (!) 48    Temp 97.9 F (36.6 C) (Oral)    Resp 20    Ht 1.626 m (5\' 4" )    Wt 117.9 kg    SpO2 96%    BMI 44.63 kg/m   Physical Exam Vitals and nursing note  reviewed.  Constitutional:      Appearance: She is well-developed and well-nourished. She is obese. She is not ill-appearing.  HENT:     Head: Normocephalic and atraumatic.     Nose: Nose normal.     Mouth/Throat:     Mouth: Mucous membranes are moist.  Eyes:     Pupils: Pupils are equal, round, and reactive to light.  Cardiovascular:     Rate and Rhythm: Normal rate and regular rhythm.     Heart sounds: Normal heart sounds.  Pulmonary:     Effort: Pulmonary effort is normal. No respiratory distress.     Breath sounds: No wheezing.  Abdominal:     General: Bowel sounds are normal.     Palpations: Abdomen is soft.     Tenderness: There is no guarding or rebound.  Musculoskeletal:     Cervical back: Neck supple.     Right lower leg: No edema.     Left lower leg: No edema.  Skin:    General: Skin is warm and dry.  Neurological:     Mental Status: She is alert and oriented to person, place, and time.     Comments: Cranial nerves II  through XII intact, 5 out of 5 strength in all 4 extremities, no dysmetria to finger-nose-finger  Psychiatric:        Mood and Affect: Mood and affect and mood normal.     ED Results / Procedures / Treatments   Labs (all labs ordered are listed, but only abnormal results are displayed) Labs Reviewed  BASIC METABOLIC PANEL - Abnormal; Notable for the following components:      Result Value   Potassium 3.1 (*)    Glucose, Bld 200 (*)    Creatinine, Ser 1.21 (*)    GFR, Estimated 47 (*)    All other components within normal limits  CBC WITH DIFFERENTIAL/PLATELET  TROPONIN I (HIGH SENSITIVITY)  TROPONIN I (HIGH SENSITIVITY)    EKG EKG Interpretation  Date/Time:  Friday December 01 2020 22:59:36 EST Ventricular Rate:  64 PR Interval:    QRS Duration: 102 QT Interval:  420 QTC Calculation: 434 R Axis:   -2 Text Interpretation: Sinus rhythm Low voltage, precordial leads Borderline T abnormalities, diffuse leads No significant change since 03/17/2016 Confirmed by Geoffery Lyons ([id number]) on 12/01/2020 11:03:47 PM   Radiology DG Chest Portable 1 View  Result Date: 12/01/2020 CLINICAL DATA:  Chest pressure dizziness for 1 week, recent changes to antihypertensive EXAM: PORTABLE CHEST 1 VIEW COMPARISON:  Radiograph 03/17/2016 FINDINGS: Chronically coarsened interstitial and bronchitic features are similar to prior. Diminished lung volumes with some hazy basilar opacities which are likely atelectatic without other convincing features of edema. Cardiomediastinal contours are unremarkable given portable technique. No acute osseous or soft tissue abnormality. Telemetry leads overlie the chest. IMPRESSION: 1. Diminished lung volumes with some hazy basilar opacities, likely atelectasis. 2. Chronically coarsened interstitial and bronchitic features. Electronically Signed   By: Kreg Shropshire M.D.   On: 12/01/2020 23:41    Procedures Procedures (including critical care time)  Medications Ordered in  ED Medications - No data to display  ED Course  I have reviewed the triage vital signs and the nursing notes.  Pertinent labs & imaging results that were available during my care of the patient were reviewed by me and considered in my medical decision making (see chart for details).    MDM Rules/Calculators/A&P  Patient presents with concerns for high blood pressure.  She is overall nontoxic-appearing.  Initial blood pressures 160s/70s.  Initially endorses some lightheadedness and chest pain which has resolved on my evaluation.  Reports this is an ongoing issue which she is seeing her primary physician for.  Physical exam without signs or symptoms of hypertensive urgency or emergency.  She is neurologically intact.  Doubt stroke.  She has risk factors for ACS including hypertension and hyperlipidemia; however, EKG is without acute ischemic changes.  Chest x-ray shows no evidence of pneumothorax or pneumonia.  She has been chest pain-free and pain was nonexertional.  Initial troponin is 4.  Patient was monitored with trending down of her blood pressure to 129/66.  Orthostatics are negative.  Labs are largely reassuring.  Repeat troponin is also negative.  Patient has remained asymptomatic in the emergency room.  For this reason, feel it is reasonable for her to be discharged home with ongoing outpatient management and follow-up with cardiology.  After history, exam, and medical workup I feel the patient has been appropriately medically screened and is safe for discharge home. Pertinent diagnoses were discussed with the patient. Patient was given return precautions.  Final Clinical Impression(s) / ED Diagnoses Final diagnoses:  Primary hypertension  Atypical chest pain    Rx / DC Orders ED Discharge Orders    None       Meagan Ancona, Mayer Masker, MD 12/02/20 [phone]

## 2020-12-01 NOTE — ED Notes (Signed)
Orthostatic vital signs completed 

## 2020-12-01 NOTE — ED Triage Notes (Signed)
Pt c/o high blood pressure and dizziness over the last week. Pt reports her doctor has been changing her medications around to help lower her blood pressure but they aren't helping. Pt denies visual disturbances and headache.

## 2020-12-02 LAB — BASIC METABOLIC PANEL
Anion gap: 10 (ref 5–15)
BUN: 13 mg/dL (ref 8–23)
CO2: 24 mmol/L (ref 22–32)
Calcium: 9.6 mg/dL (ref 8.9–10.3)
Chloride: 101 mmol/L (ref 98–111)
Creatinine, Ser: 1.21 mg/dL — ABNORMAL HIGH (ref 0.44–1.00)
GFR, Estimated: 47 mL/min — ABNORMAL LOW (ref >60)
Glucose, Bld: 200 mg/dL — ABNORMAL HIGH (ref 70–99)
Potassium: 3.1 mmol/L — ABNORMAL LOW (ref 3.5–5.1)
Sodium: 135 mmol/L (ref 135–145)

## 2020-12-02 LAB — TROPONIN I (HIGH SENSITIVITY)
Troponin I (High Sensitivity): 4 ng/L (ref <18)
Troponin I (High Sensitivity): 4 ng/L (ref <18)

## 2020-12-02 NOTE — Discharge Instructions (Addendum)
You were seen today with concerns for high blood pressure.  Work-up is reassuring.  Follow back up with your primary physician for ongoing medication management.  Given that he had some chest discomfort, you should follow-up with cardiology for outpatient evaluation and possible stress testing.

## 2020-12-02 NOTE — ED Notes (Signed)
Patient discharged to home.  All discharge instructions reviewed.  Patient receptive and demonstrated understanding via teachback method.  Ambulatory out of ED.   

## 2021-05-26 ENCOUNTER — Emergency Department (HOSPITAL_COMMUNITY): Payer: MEDICARE

## 2021-05-26 ENCOUNTER — Emergency Department (HOSPITAL_COMMUNITY)
Admission: EM | Admit: 2021-05-26 | Discharge: 2021-05-26 | Disposition: A | Discharge status: Home / Self Care | Payer: MEDICARE | Attending: Emergency Medicine | Admitting: Emergency Medicine

## 2021-05-26 ENCOUNTER — Encounter (HOSPITAL_COMMUNITY): Payer: Self-pay | Admitting: *Deleted

## 2021-05-26 ENCOUNTER — Other Ambulatory Visit: Payer: Self-pay

## 2021-05-26 DIAGNOSIS — K5790 Diverticulosis of intestine, part unspecified, without perforation or abscess without bleeding: Secondary | ICD-10-CM | POA: Insufficient documentation

## 2021-05-26 DIAGNOSIS — M545 Low back pain, unspecified: Secondary | ICD-10-CM | POA: Diagnosis not present

## 2021-05-26 DIAGNOSIS — I700 Atherosclerosis of aorta: Secondary | ICD-10-CM | POA: Insufficient documentation

## 2021-05-26 DIAGNOSIS — Z79899 Other long term (current) drug therapy: Secondary | ICD-10-CM | POA: Insufficient documentation

## 2021-05-26 DIAGNOSIS — I10 Essential (primary) hypertension: Secondary | ICD-10-CM | POA: Insufficient documentation

## 2021-05-26 DIAGNOSIS — M1611 Unilateral primary osteoarthritis, right hip: Secondary | ICD-10-CM | POA: Diagnosis not present

## 2021-05-26 LAB — LIPASE, BLOOD: Lipase: 32 U/L (ref 11–51)

## 2021-05-26 LAB — CBC WITH DIFFERENTIAL/PLATELET
Abs Immature Granulocytes: 0.01 10*3/uL (ref 0.00–0.07)
Basophils Absolute: 0 10*3/uL (ref 0.0–0.1)
Basophils Relative: 0 %
Eosinophils Absolute: 0.2 10*3/uL (ref 0.0–0.5)
Eosinophils Relative: 3 %
HCT: 37.8 % (ref 36.0–46.0)
Hemoglobin: 12.7 g/dL (ref 12.0–15.0)
Immature Granulocytes: 0 %
Lymphocytes Relative: 31 %
Lymphs Abs: 2.2 10*3/uL (ref 0.7–4.0)
MCH: 34.6 pg — ABNORMAL HIGH (ref 26.0–34.0)
MCHC: 33.6 g/dL (ref 30.0–36.0)
MCV: 103 fL — ABNORMAL HIGH (ref 80.0–100.0)
Monocytes Absolute: 0.4 10*3/uL (ref 0.1–1.0)
Monocytes Relative: 5 %
Neutro Abs: 4.4 10*3/uL (ref 1.7–7.7)
Neutrophils Relative %: 61 %
Platelets: 231 10*3/uL (ref 150–400)
RBC: 3.67 MIL/uL — ABNORMAL LOW (ref 3.87–5.11)
RDW: 11.7 % (ref 11.5–15.5)
WBC: 7.3 10*3/uL (ref 4.0–10.5)
nRBC: 0 % (ref 0.0–0.2)

## 2021-05-26 LAB — URINALYSIS, ROUTINE W REFLEX MICROSCOPIC
Bilirubin Urine: NEGATIVE
Glucose, UA: NEGATIVE mg/dL
Ketones, ur: NEGATIVE mg/dL
Leukocytes,Ua: NEGATIVE
Nitrite: NEGATIVE
Protein, ur: NEGATIVE mg/dL
Specific Gravity, Urine: 1.004 — ABNORMAL LOW (ref 1.005–1.030)
pH: 7 (ref 5.0–8.0)

## 2021-05-26 LAB — COMPREHENSIVE METABOLIC PANEL
ALT: 23 U/L (ref 0–44)
AST: 22 U/L (ref 15–41)
Albumin: 4 g/dL (ref 3.5–5.0)
Alkaline Phosphatase: 64 U/L (ref 38–126)
Anion gap: 9 (ref 5–15)
BUN: 30 mg/dL — ABNORMAL HIGH (ref 8–23)
CO2: 25 mmol/L (ref 22–32)
Calcium: 9 mg/dL (ref 8.9–10.3)
Chloride: 102 mmol/L (ref 98–111)
Creatinine, Ser: 1.44 mg/dL — ABNORMAL HIGH (ref 0.44–1.00)
GFR, Estimated: 38 mL/min — ABNORMAL LOW (ref >60)
Glucose, Bld: 153 mg/dL — ABNORMAL HIGH (ref 70–99)
Potassium: 3.5 mmol/L (ref 3.5–5.1)
Sodium: 136 mmol/L (ref 135–145)
Total Bilirubin: 0.4 mg/dL (ref 0.3–1.2)
Total Protein: 7.1 g/dL (ref 6.5–8.1)

## 2021-05-26 MED ORDER — LIDOCAINE 5 % EX PTCH: 1.0000 | MEDICATED_PATCH | CUTANEOUS | 0 refills | Status: AC

## 2021-05-26 MED ORDER — OXYCODONE-ACETAMINOPHEN 5-325 MG PO TABS
1.0000 | ORAL_TABLET | Freq: Once | ORAL | Status: AC
  Administered 2021-05-26: 1 via ORAL
  Filled 2021-05-26: qty 1

## 2021-05-26 MED ORDER — HYDROCODONE-ACETAMINOPHEN 5-325 MG PO TABS: 1.0000 | ORAL_TABLET | Freq: Four times a day (QID) | ORAL | 0 refills | Status: AC | PRN

## 2021-05-26 MED ORDER — MORPHINE SULFATE (PF) 2 MG/ML IV SOLN
4.0000 mg | Freq: Once | INTRAVENOUS | Status: AC
  Administered 2021-05-26: 4 mg via INTRAVENOUS
  Filled 2021-05-26: qty 2

## 2021-05-26 MED ORDER — DICLOFENAC SODIUM 1 % EX GEL: 2.0000 g | Freq: Four times a day (QID) | CUTANEOUS | 0 refills | Status: AC

## 2021-05-26 MED ORDER — SODIUM CHLORIDE 0.9 % IV BOLUS
1000.0000 mL | Freq: Once | INTRAVENOUS | Status: AC
  Administered 2021-05-26: 1000 mL via INTRAVENOUS

## 2021-05-26 NOTE — ED Triage Notes (Signed)
Pt c/o LLQ abdominal pain that radiates to both the left flank and down to lower abdomen x 2 days. Denies n/v/d, fever, difficulty voiding, hematuria.

## 2021-05-26 NOTE — ED Notes (Addendum)
Ambulated pt. To restroom. Pt. Had a steady gait using a walker to bathroom.  Pt. Was complaining of back pain.

## 2021-05-26 NOTE — ED Provider Notes (Signed)
[facility]Y [facility]PART[facility] Provider Note   CSN: [id number] Arrival date & time: 05/26/21  [id number]     History Chief Complaint  Patient presents with   Abdominal Pain    Reginia FortsRachel A Godbee is a 75 y.o. female.  HPI  Patient is a [age] female with history of hyper lipidemia, hypertension, who presents to the emergency department today for evaluation of back pain.  States for the last 2 days she has had pain to the left mid back/flank area.  Pain improves at rest but is worse with any movement.  It feels sharp in nature and she rates the pain 10/10.  The pain intermittently radiates around to her left mid abdomen.  She denies any fevers nausea, vomiting diarrhea, constipation, dysuria, frequency urgency or hematuria.  She denies any chest pain or shortness of breath.  Denies any recent falls, trauma or heavy lifting.  She tried taking ibuprofen prior to arrival which mildly improved her symptoms.  States she has never had similar pain before. Denies numbness/weakness to the ble and denies radiation of pain to the legs. Has had no loss of control of bowel or bladder function. No hx ca.  Past Medical History:  Diagnosis Date   Hypercholesterolemia    Hypertension     There are no problems to display for this patient.   Past Surgical History:  Procedure Laterality Date   ABDOMINAL HYSTERECTOMY       OB History     Gravida      Para      Term      Preterm      AB      Living  3      SAB      IAB      Ectopic      Multiple      Live Births              Family History  Problem Relation Age of Onset   Diabetes Other    Hypertension Other    Cancer Other     Social History   Tobacco Use   Smoking status: Never   Smokeless tobacco: Never  Vaping Use   Vaping Use: Never used  Substance Use Topics   Alcohol use: No   Drug use: No    Home Medications Prior to Admission medications   Medication Sig Start Date End Date Taking? Authorizing Provider   diclofenac Sodium (VOLTAREN) 1 % GEL Apply 2 g topically 4 (four) times daily. 05/26/21  Yes Tequita Marrs S, PA-C  HYDROcodone-acetaminophen (NORCO/VICODIN) 5-325 MG tablet Take 1 tablet by mouth every 6 (six) hours as needed. 05/26/21  Yes Kaci Freel S, PA-C  lidocaine (LIDODERM) 5 % Place 1 patch onto the skin daily. Remove & Discard patch within 12 hours or as directed by MD 05/26/21  Yes Bryelle Spiewak S, PA-C  albuterol (PROVENTIL HFA;VENTOLIN HFA) 108 (90 Base) MCG/ACT inhaler Inhale 1-2 puffs into the lungs every 6 (six) hours as needed for wheezing or shortness of breath. 03/17/16   Charlynne PanderYao, David Hsienta, MD  chlorthalidone (HYGROTON) 25 MG tablet Take 25 mg by mouth daily. 03/19/21   [provider]  diltiazem (CARDIZEM CD) 360 MG 24 hr capsule Take 360 mg by mouth daily. 04/04/21   [provider]  LUMIGAN 0.01 % SOLN Place 1 drop into both eyes at bedtime. 04/26/21   [provider]  metoprolol succinate (TOPROL-XL) 50 MG 24 hr tablet Take 50 mg by  mouth daily. 05/14/21   [provider]  olmesartan (BENICAR) 40 MG tablet Take 40 mg by mouth daily. 05/14/21   [provider]  Potassium Chloride CR (MICRO-K) 8 MEQ CPCR capsule CR Take 1 capsule by mouth daily. 05/03/21   [provider]  SIMBRINZA 1-0.2 % SUSP Apply 1 drop to eye 2 (two) times daily. 04/28/21   [provider]    Allergies    Penicillins  Review of Systems   Review of Systems  Constitutional:  Negative for fever.  HENT:  Negative for ear pain and sore throat.   Eyes:  Negative for visual disturbance.  Respiratory:  Negative for cough and shortness of breath.   Cardiovascular:  Negative for chest pain.  Gastrointestinal:  Positive for abdominal pain. Negative for constipation, diarrhea, nausea and vomiting.  Genitourinary:  Positive for flank pain. Negative for dysuria, hematuria and urgency.  Musculoskeletal:  Positive for back pain.  Skin:  Negative for  rash.  Neurological:  Negative for weakness and numbness.  All other systems reviewed and are negative.  Physical Exam Updated Vital Signs BP 129/70   Pulse (!) 54   Temp 98.9 F (37.2 C) (Oral)   Resp 20   Ht 5\' 4"  (1.626 m)   Wt 117.9 kg   SpO2 97%   BMI 44.63 kg/m   Physical Exam Vitals and nursing note reviewed.  Constitutional:      General: She is not in acute distress.    Appearance: She is well-developed.  HENT:     Head: Normocephalic and atraumatic.  Eyes:     Conjunctiva/sclera: Conjunctivae normal.  Cardiovascular:     Rate and Rhythm: Normal rate and regular rhythm.     Heart sounds: Normal heart sounds. No murmur heard. Pulmonary:     Effort: Pulmonary effort is normal. No respiratory distress.     Breath sounds: Normal breath sounds.  Abdominal:     General: Bowel sounds are normal.     Palpations: Abdomen is soft.     Tenderness: There is no abdominal tenderness. There is no left CVA tenderness.  Musculoskeletal:     Cervical back: Neck supple.     Comments: No midline thoracic or lumbar TTP. TTP noted to the left thoracic/lumbar paraspinous musculature. No rashes or skin changes noted.   Skin:    General: Skin is warm and dry.  Neurological:     Mental Status: She is alert.     Comments: 5/5 strength noted to the BLE with normal sensation throughout    ED Results / Procedures / Treatments   Labs (all labs ordered are listed, but only abnormal results are displayed) Labs Reviewed  CBC WITH DIFFERENTIAL/PLATELET - Abnormal; Notable for the following components:      Result Value   RBC 3.67 (*)    MCV 103.0 (*)    MCH 34.6 (*)    All other components within normal limits  COMPREHENSIVE METABOLIC PANEL - Abnormal; Notable for the following components:   Glucose, Bld 153 (*)    BUN 30 (*)    Creatinine, Ser 1.44 (*)    GFR, Estimated 38 (*)    All other components within normal limits  URINALYSIS, ROUTINE W REFLEX MICROSCOPIC - Abnormal;  Notable for the following components:   Color, Urine COLORLESS (*)    Specific Gravity, Urine 1.004 (*)    Hgb urine dipstick SMALL (*)    Bacteria, UA FEW (*)    All other components within  normal limits  LIPASE, BLOOD    EKG None  Radiology CT Renal Stone Study  Result Date: 05/26/2021 CLINICAL DATA:  Left lower quadrant, flank pain EXAM: CT ABDOMEN AND PELVIS WITHOUT CONTRAST TECHNIQUE: Multidetector CT imaging of the abdomen and pelvis was performed following the standard protocol without IV contrast. COMPARISON:  None. FINDINGS: Lower chest: No pleural or pericardial effusion. Hepatobiliary: No focal liver abnormality is seen. No gallstones, gallbladder wall thickening, or biliary dilatation. Pancreas: Unremarkable. No pancreatic ductal dilatation or surrounding inflammatory changes. Spleen: Normal in size without focal abnormality. Adrenals/Urinary Tract: Adrenal glands are unremarkable. Kidneys are normal, without renal calculi, focal lesion, or hydronephrosis. Bladder is unremarkable. Stomach/Bowel: Stomach and small bowel decompressed. Normal appendix. The colon is nondilated with a few scattered diverticula; no adjacent inflammatory change. Vascular/Lymphatic: Scattered mild aortic and iliofemoral arterial calcified plaque without aneurysm. No abdominal or pelvic adenopathy. Reproductive: Status post hysterectomy. No adnexal masses. Other: No ascites.  No free air. Musculoskeletal: Small paraumbilical hernia containing only mesenteric fat. Mild spondylitic changes in the lumbar spine. Advanced right hip DJD. No fracture or other acute bone abnormality. IMPRESSION: 1. No acute findings. 2. Scattered colonic diverticula. 3. Advanced right hip DJD. 4. Aortic Atherosclerosis (ICD10-170.0). Electronically Signed   By: Corlis Leak M.D.   On: 05/26/2021 11:42    Procedures Procedures   Medications Ordered in ED Medications  oxyCODONE-acetaminophen (PERCOCET/ROXICET) 5-325 MG per tablet 1  tablet (1 tablet Oral Given 05/26/21 1100)  sodium chloride 0.9 % bolus 1,000 mL (0 mLs Intravenous Stopped 05/26/21 1302)  morphine 2 MG/ML injection 4 mg (4 mg Intravenous Given 05/26/21 1210)    ED Course  I have reviewed the triage vital signs and the nursing notes.  Pertinent labs & imaging results that were available during my care of the patient were reviewed by me and considered in my medical decision making (see chart for details).    MDM Rules/Calculators/A&P                          75 y/o female presenting for eval of left mid back/flank pain ongoing for 2 days   Reviewed/interpreted labs  CBC w/o leukocytosis or anemia CMP with mild elevation in the BUN/Cr that is slightly worse than prior  - hydration provided in ed  Lipase neg UA w/o signs of infection  Reviewed/interpreted imaging CT renal - 1. No acute findings. 2. Scattered colonic diverticula. 3. Advanced right hip DJD. 4. Aortic Atherosclerosis  W/u thus far reassuring. Pt felt improved after pain meds and was able to ambulated with a walker here in the ed. She has a walker at home and is advised to use this for further stabilization until her pain improves. Her sxs seem msk in nature today. Ct imaging was unrevealing for emergent intraabdominal cause of sxs at this time. She is stable appearing and in no distress on reeval. Will dx with symptomatic tx and plan for f/u with her primary care provider. Advised on specific return precautions. She voices understanding of the plan and reasons to return, all questions answered, pt stable for d/c.   Final Clinical Impression(s) / ED Diagnoses Final diagnoses:  Acute left-sided low back pain without sciatica  Aortic atherosclerosis (HCC)  Diverticulosis  Osteoarthritis of right hip, unspecified osteoarthritis type    Rx / DC Orders ED Discharge Orders          Ordered    lidocaine (LIDODERM) 5 %  Every 24  hours        05/26/21 1152    diclofenac Sodium (VOLTAREN) 1  % GEL  4 times daily        05/26/21 1152    HYDROcodone-acetaminophen (NORCO/VICODIN) 5-325 MG tablet  Every 6 hours PRN        05/26/21 1307             Karrie Meres, PA-C 05/26/21 1311    Eber Hong, MD 05/27/21 [phone]

## 2021-05-26 NOTE — ED Notes (Signed)
Pt reports pain is better with ambulation after morphine.  Denies pain at rest.

## 2021-05-26 NOTE — Discharge Instructions (Addendum)
Please use the lidoderm patches and voltaren gel for your pain. Prescription given for Norco. Take medication as directed and do not operate machinery, drive a car, or work while taking this medication as it can make you drowsy. It can also make you constipated.   Please follow up with your primary care provider within 5-7 days for re-evaluation of your symptoms.   Return to the emergency department immediately if you experience any back pain associated with fevers, loss of control of your bowels/bladder, weakness/numbness to your legs, numbness to your groin area, inability to walk, or inability to urinate.

## 2022-03-20 IMAGING — CT CT RENAL STONE PROTOCOL
2 of 4 series · 17 of 46 positions shown, 19 images · non-contrast
Comparison: None.

CLINICAL DATA: Left lower quadrant, flank pain

EXAM:
CT ABDOMEN AND PELVIS WITHOUT CONTRAST
TECHNIQUE: Multidetector CT imaging of the abdomen and pelvis was performed
following the standard protocol without IV contrast.

[Series 2: axial st · axial · 0.98mm/px · z∈[+490,+935]mm · 14 of 101 slices shown, 16 images]
[im 6/101  soft-tissue]
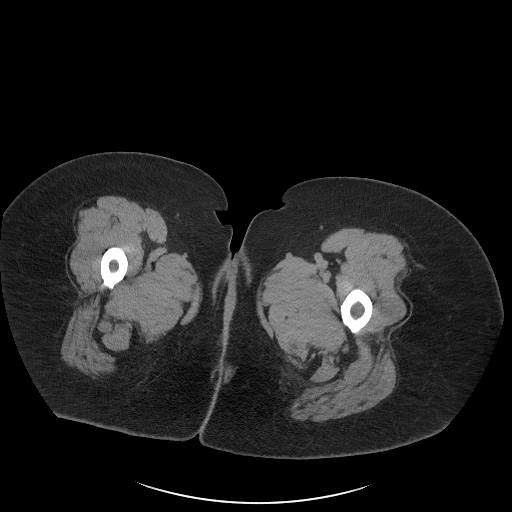
[im 6/101  bone]
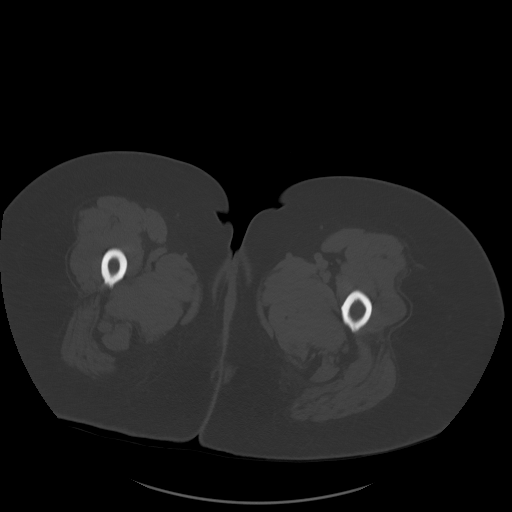
[im 12/101  soft-tissue]
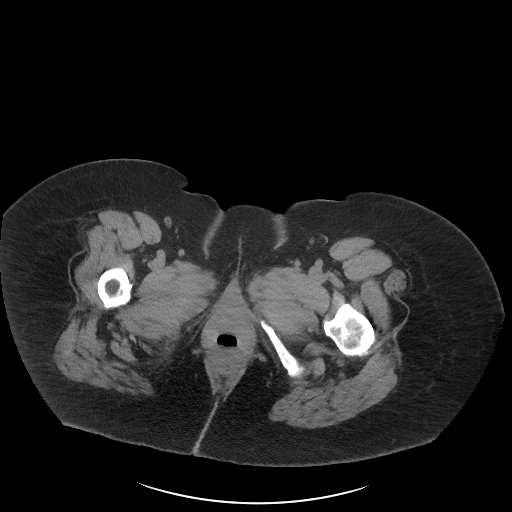
[im 18/101  soft-tissue]
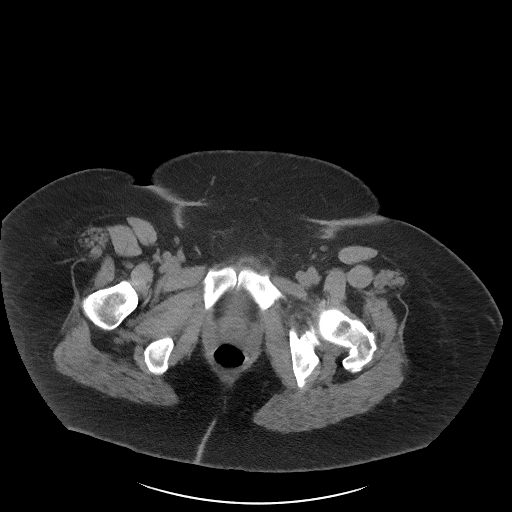
[im 30/101  soft-tissue]
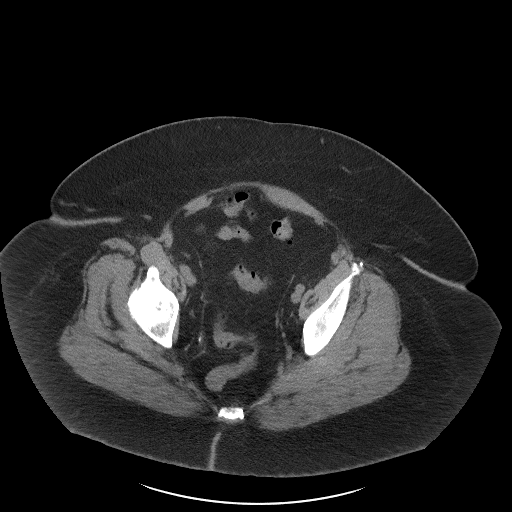
[im 36/101  soft-tissue]
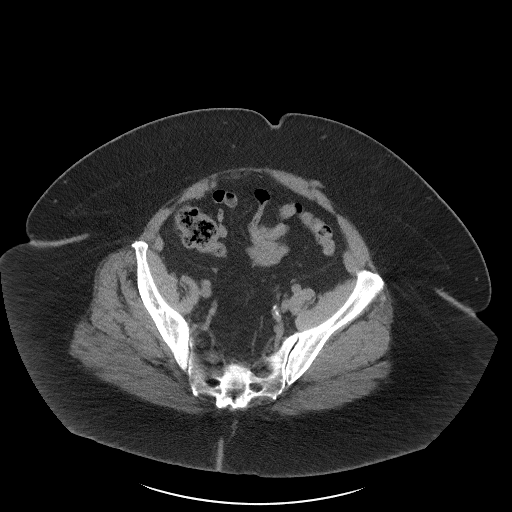
[im 42/101  soft-tissue]
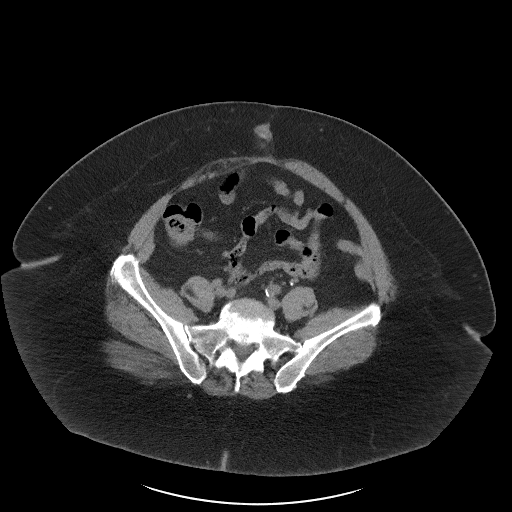
[im 48/101  soft-tissue]
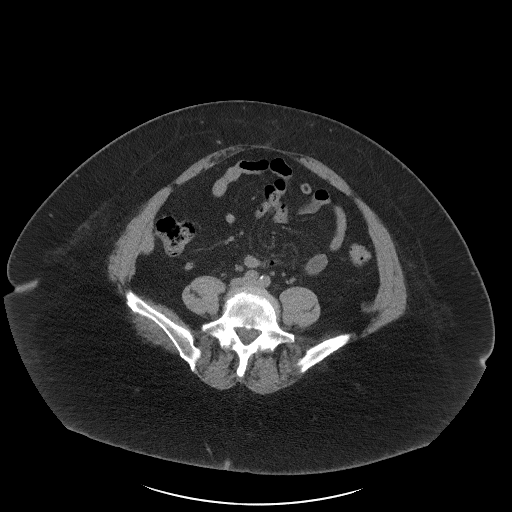
[im 53/101  soft-tissue]
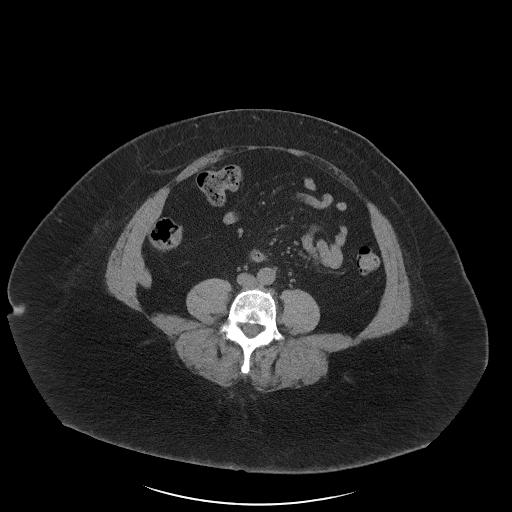
[im 59/101  soft-tissue]
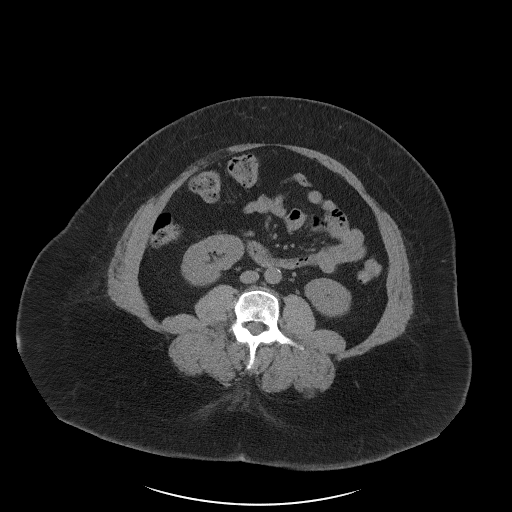
[im 59/101  bone]
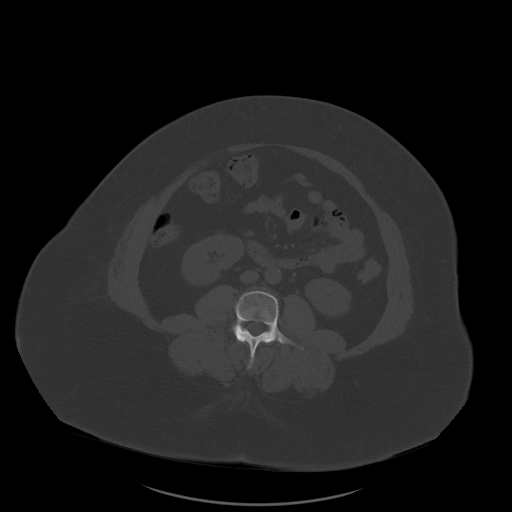
[im 65/101  soft-tissue]
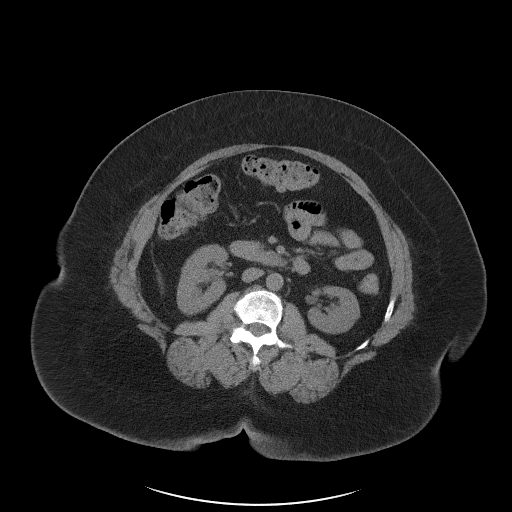
[im 77/101  soft-tissue]
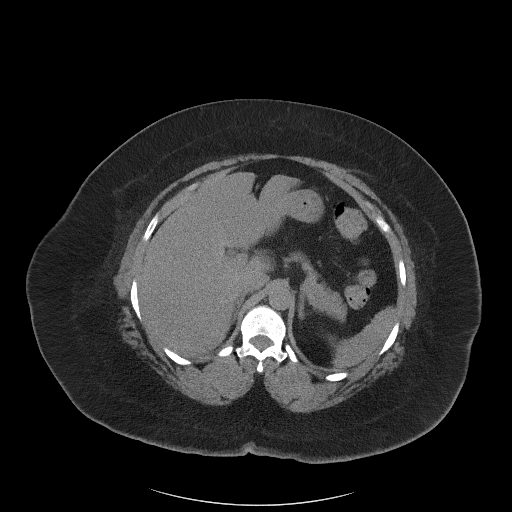
[im 83/101  soft-tissue]
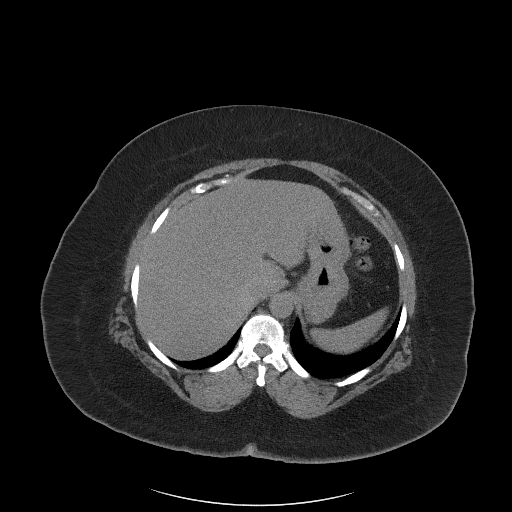
[im 89/101  soft-tissue]
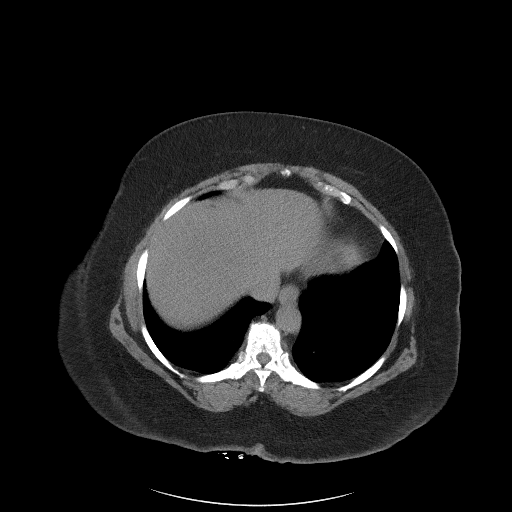
[im 95/101  soft-tissue]
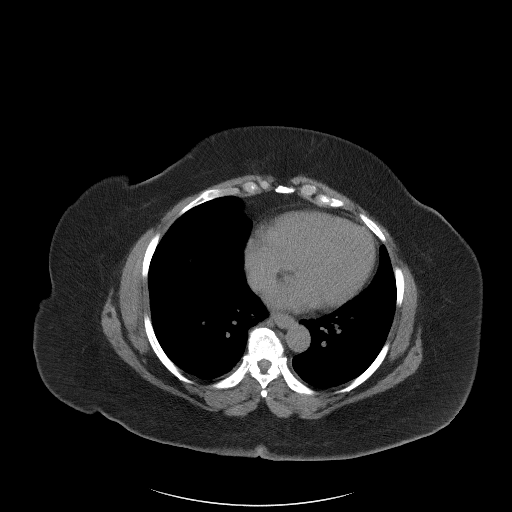

[Series 5: coronal st · coronal · 0.98mm/px · 3 of 127 slices shown]
[im 43/127  soft-tissue]
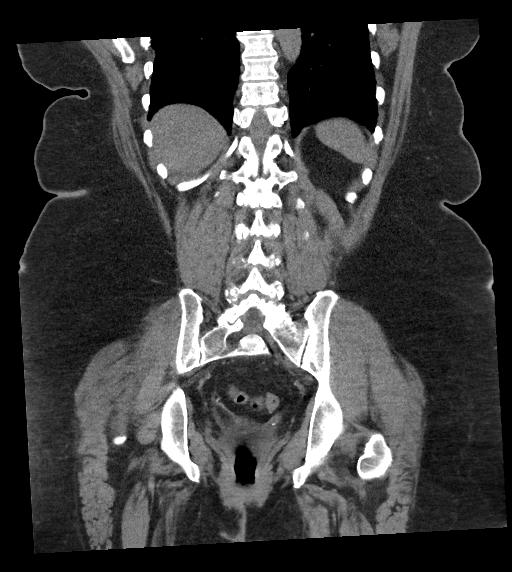
[im 57/127  soft-tissue]
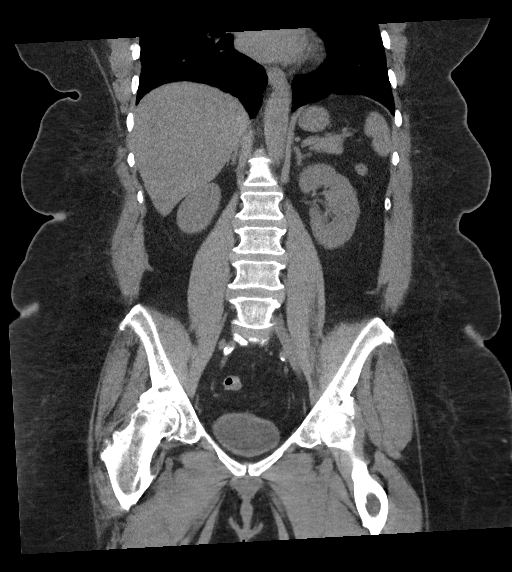
[im 71/127  soft-tissue]
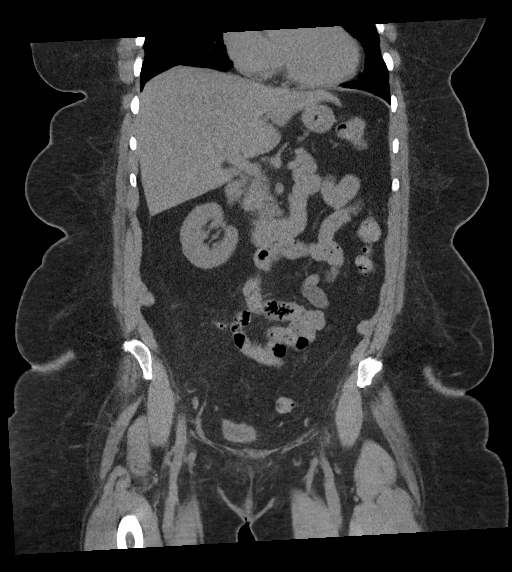

[17 of 46 positions shown; findings below may reference images not displayed]

FINDINGS: Lower chest: No pleural or pericardial effusion.

Hepatobiliary: No focal liver abnormality is seen. No gallstones,
gallbladder wall thickening, or biliary dilatation.

Pancreas: Unremarkable. No pancreatic ductal dilatation or
surrounding inflammatory changes.

Spleen: Normal in size without focal abnormality.

Adrenals/Urinary Tract: Adrenal glands are unremarkable. Kidneys are
normal, without renal calculi, focal lesion, or hydronephrosis.
Bladder is unremarkable.

Stomach/Bowel: Stomach and small bowel decompressed. Normal
appendix. The colon is nondilated with a few scattered diverticula;
no adjacent inflammatory change.

Vascular/Lymphatic: Scattered mild aortic and iliofemoral arterial
calcified plaque without aneurysm. No abdominal or pelvic
adenopathy.

Reproductive: Status post hysterectomy. No adnexal masses.

Other: No ascites.  No free air.

Musculoskeletal: Small paraumbilical hernia containing only
mesenteric fat. Mild spondylitic changes in the lumbar spine.
Advanced right hip DJD. No fracture or other acute bone abnormality.
IMPRESSION: 1. No acute findings.
2. Scattered colonic diverticula.
3. Advanced right hip DJD.
4. Aortic Atherosclerosis ([id number]).
